# Patient Record
Sex: Female | Born: 1990
Health system: Southern US, Community
[De-identification: ages and names within clinical notes are randomized; demographics above are authoritative.]

---

## 2018-08-18 ENCOUNTER — Emergency Department (HOSPITAL_BASED_OUTPATIENT_CLINIC_OR_DEPARTMENT_OTHER): Payer: BC Managed Care – PPO

## 2018-08-18 ENCOUNTER — Emergency Department (HOSPITAL_BASED_OUTPATIENT_CLINIC_OR_DEPARTMENT_OTHER)
Admission: EM | Admit: 2018-08-18 | Discharge: 2018-08-18 | Disposition: A | Discharge status: Home / Self Care | Payer: BC Managed Care – PPO | Attending: Emergency Medicine | Admitting: Emergency Medicine

## 2018-08-18 ENCOUNTER — Encounter (HOSPITAL_BASED_OUTPATIENT_CLINIC_OR_DEPARTMENT_OTHER): Payer: Self-pay | Admitting: *Deleted

## 2018-08-18 ENCOUNTER — Other Ambulatory Visit: Payer: Self-pay

## 2018-08-18 DIAGNOSIS — R10816 Epigastric abdominal tenderness: Secondary | ICD-10-CM | POA: Insufficient documentation

## 2018-08-18 DIAGNOSIS — R112 Nausea with vomiting, unspecified: Secondary | ICD-10-CM | POA: Diagnosis not present

## 2018-08-18 DIAGNOSIS — Z79899 Other long term (current) drug therapy: Secondary | ICD-10-CM | POA: Diagnosis not present

## 2018-08-18 DIAGNOSIS — R197 Diarrhea, unspecified: Secondary | ICD-10-CM | POA: Diagnosis not present

## 2018-08-18 DIAGNOSIS — M7918 Myalgia, other site: Secondary | ICD-10-CM | POA: Diagnosis present

## 2018-08-18 LAB — CBC WITH DIFFERENTIAL/PLATELET
Abs Immature Granulocytes: 0.01 10*3/uL (ref 0.00–0.07)
Basophils Absolute: 0 10*3/uL (ref 0.0–0.1)
Basophils Relative: 0 %
Eosinophils Absolute: 0 10*3/uL (ref 0.0–0.5)
Eosinophils Relative: 0 %
HEMATOCRIT: 36.9 % (ref 36.0–46.0)
Hemoglobin: 11.9 g/dL — ABNORMAL LOW (ref 12.0–15.0)
IMMATURE GRANULOCYTES: 0 %
Lymphocytes Relative: 30 %
Lymphs Abs: 1 10*3/uL (ref 0.7–4.0)
MCH: 27.9 pg (ref 26.0–34.0)
MCHC: 32.2 g/dL (ref 30.0–36.0)
MCV: 86.6 fL (ref 80.0–100.0)
Monocytes Absolute: 0.1 10*3/uL (ref 0.1–1.0)
Monocytes Relative: 3 %
Neutro Abs: 2.2 10*3/uL (ref 1.7–7.7)
Neutrophils Relative %: 67 %
Platelets: 154 10*3/uL (ref 150–400)
RBC: 4.26 MIL/uL (ref 3.87–5.11)
RDW: 13.3 % (ref 11.5–15.5)
WBC: 3.3 10*3/uL — ABNORMAL LOW (ref 4.0–10.5)
nRBC: 0 % (ref 0.0–0.2)

## 2018-08-18 LAB — URINALYSIS, MICROSCOPIC (REFLEX)

## 2018-08-18 LAB — COMPREHENSIVE METABOLIC PANEL
ALBUMIN: 3.5 g/dL (ref 3.5–5.0)
ALT: 66 U/L — ABNORMAL HIGH (ref 0–44)
AST: 176 U/L — ABNORMAL HIGH (ref 15–41)
Alkaline Phosphatase: 40 U/L (ref 38–126)
Anion gap: 9 (ref 5–15)
BUN: 15 mg/dL (ref 6–20)
CO2: 21 mmol/L — ABNORMAL LOW (ref 22–32)
Calcium: 8.4 mg/dL — ABNORMAL LOW (ref 8.9–10.3)
Chloride: 97 mmol/L — ABNORMAL LOW (ref 98–111)
Creatinine, Ser: 1.13 mg/dL — ABNORMAL HIGH (ref 0.44–1.00)
GFR calc Af Amer: >60 mL/min (ref >60)
GFR calc non Af Amer: >60 mL/min (ref >60)
Glucose, Bld: 96 mg/dL (ref 70–99)
Potassium: 3.4 mmol/L — ABNORMAL LOW (ref 3.5–5.1)
Sodium: 127 mmol/L — ABNORMAL LOW (ref 135–145)
Total Bilirubin: 0.6 mg/dL (ref 0.3–1.2)
Total Protein: 8 g/dL (ref 6.5–8.1)

## 2018-08-18 LAB — URINALYSIS, ROUTINE W REFLEX MICROSCOPIC
Glucose, UA: NEGATIVE mg/dL
KETONES UR: 15 mg/dL — AB
Leukocytes, UA: NEGATIVE
NITRITE: NEGATIVE
PH: 6 (ref 5.0–8.0)
Protein, ur: >300 mg/dL — AB
Specific Gravity, Urine: 1.025 (ref 1.005–1.030)

## 2018-08-18 LAB — LIPASE, BLOOD: Lipase: 193 U/L — ABNORMAL HIGH (ref 11–51)

## 2018-08-18 LAB — PREGNANCY, URINE: Preg Test, Ur: NEGATIVE

## 2018-08-18 MED ORDER — SODIUM CHLORIDE 0.9 % IV BOLUS
1000.0000 mL | Freq: Once | INTRAVENOUS | Status: AC
  Administered 2018-08-18: 1000 mL via INTRAVENOUS

## 2018-08-18 MED ORDER — ACETAMINOPHEN 325 MG PO TABS
650.0000 mg | ORAL_TABLET | Freq: Once | ORAL | Status: AC
  Administered 2018-08-18: 650 mg via ORAL
  Filled 2018-08-18: qty 2

## 2018-08-18 MED ORDER — IOPAMIDOL (ISOVUE-300) INJECTION 61%
100.0000 mL | Freq: Once | INTRAVENOUS | Status: AC | PRN
  Administered 2018-08-18: 100 mL via INTRAVENOUS

## 2018-08-18 MED ORDER — AMOXICILLIN-POT CLAVULANATE 875-125 MG PO TABS: 1.0000 | ORAL_TABLET | Freq: Two times a day (BID) | ORAL | 0 refills | Status: AC

## 2018-08-18 MED ORDER — ONDANSETRON HCL 4 MG/2ML IJ SOLN
4.0000 mg | Freq: Once | INTRAMUSCULAR | Status: AC
  Administered 2018-08-18: 4 mg via INTRAVENOUS
  Filled 2018-08-18: qty 2

## 2018-08-18 MED ORDER — ONDANSETRON 4 MG PO TBDP: 4.0000 mg | ORAL_TABLET | Freq: Three times a day (TID) | ORAL | 0 refills | Status: AC | PRN

## 2018-08-18 MED FILL — ONDANSETRON ODT 4 MG TABLET: 4 | 5 days supply | Qty: 18 | Fill #0

## 2018-08-18 MED FILL — AMOX-CLAV 875-125 MG TABLET: 875-125 | 7 days supply | Qty: 14 | Fill #0

## 2018-08-18 NOTE — ED Notes (Signed)
Instructed to force fluids, has drank 12 ounces of water, since ED arrival. Water at bedside

## 2018-08-18 NOTE — ED Triage Notes (Signed)
Pt was diagnosed with the flu on 08/02/18.. she started feeling worse on 01/04. Pt went to [place] on 08/2018-02/2019. She has been having fever, diarrhea, and flu like symptoms every since.

## 2018-08-18 NOTE — ED Provider Notes (Signed)
[facility] EMERGENCY DEPARTMENT Provider Note   CSN: [id number] Arrival date & time: 08/18/18  1102   History   Chief Complaint Chief Complaint  Patient presents with  . Influenza    HPI Jaime Robbins is a 28 y.o. female with no significant past medical history who presents for evaluation of influenza-like symptoms.  Patient states she was diagnosed with influenza on 08/05/2018.  Patient states she started to feel better and went on vacation to [place].  Patient states while in [place] she started to feel ill again.  Patient states she has been running a subjective temperature, all over body aches and pains, nausea, vomiting and diarrhea.  Patient states this began on 08/12/2018.  Patient had taken nothing for symptoms.  Patient states she is able to keep down liquids and "some food" however not everything.  Patient states she has had multiple episodes of nonbloody, non-bilious emesis as well as nonbloody diarrhea.  Patient states she did drink "a lot of alcohol" while she was in [place].  Denies recent antibiotics.  States her traveling companions do not have any symptoms.  Patient states she was trying to be careful and drink bottled water while she was gone however she is unsure if any of the food she was ingesting was washed with regular water while she was away.  Patient states she is also had rhinorrhea, nasal congestion, nonproductive cough as well as sore throat.  Denies aggravating or alleviating factors.  Able to tolerate p.o. liquids without difficulty.  History provided by patient.  No interpreter was used.  HPI  History reviewed. No pertinent past medical history.  There are no active problems to display for this patient.   History reviewed. No pertinent surgical history.   OB History   No obstetric history on file.      Home Medications    Prior to Admission medications   Medication Sig Start Date End Date Taking? Authorizing Provider  citalopram (CELEXA) 10 MG  tablet Take 10 mg by mouth daily.   Yes [provider]  amoxicillin-clavulanate (AUGMENTIN) 875-125 MG tablet Take 1 tablet by mouth 2 (two) times daily for 7 days. 08/18/18 08/25/18  Cherolyn Behrle A, PA-C  ondansetron (ZOFRAN ODT) 4 MG disintegrating tablet Take 1 tablet (4 mg total) by mouth every 8 (eight) hours as needed for nausea or vomiting. 08/18/18   Ladashia Demarinis A, PA-C    Family History History reviewed. No pertinent family history.  Social History Social History   Tobacco Use  . Smoking status: Never Smoker  . Smokeless tobacco: Never Used  Substance Use Topics  . Alcohol use: Yes    Alcohol/week: 2.0 standard drinks    Types: 1 Cans of beer, 1 Shots of liquor per week  . Drug use: Never     Allergies   Patient has no known allergies.   Review of Systems Review of Systems  Constitutional: Negative for activity change, appetite change, diaphoresis and unexpected weight change.       Subjective fever.  HENT: Positive for congestion, postnasal drip, rhinorrhea and sore throat. Negative for ear discharge, ear pain, facial swelling, mouth sores, nosebleeds, sinus pressure, sinus pain, sneezing, tinnitus, trouble swallowing and voice change.   Eyes: Negative.   Respiratory: Positive for cough. Negative for choking, chest tightness, shortness of breath, wheezing and stridor.   Cardiovascular: Negative.   Gastrointestinal: Positive for diarrhea, nausea and vomiting. Negative for abdominal distention, abdominal pain, anal bleeding, blood in stool and constipation.  Genitourinary: Negative.   Musculoskeletal: Negative.   Skin: Negative.   Neurological: Negative.   All other systems reviewed and are negative.    Physical Exam Updated Vital Signs BP 132/70 (BP Location: Right Arm)   Pulse 84   Temp 99.1 F (37.3 C) (Oral)   Resp 18   Ht 5\' 4"  (1.626 m)   Wt 52.2 kg   LMP 08/02/2018 (Approximate) Comment: neg upreg  SpO2 100%   BMI 19.74 kg/m    Physical Exam Vitals signs and nursing note reviewed.  Constitutional:      General: She is not in acute distress.    Appearance: She is well-developed. She is not ill-appearing, toxic-appearing or diaphoretic.  HENT:     Head: Normocephalic and atraumatic.     Right Ear: Tympanic membrane, ear canal and external ear normal. There is no impacted cerumen.     Left Ear: Tympanic membrane, ear canal and external ear normal. There is no impacted cerumen.     Nose: Congestion and rhinorrhea present.     Comments: Clear rhinorrhea to bilateral nares.    Mouth/Throat:     Pharynx: Oropharynx is clear.     Comments: Posterior oropharynx without erythema.  Tonsils without edema or exudate.  Uvula midline without deviation.  No evidence of oropharyngeal lesions.  Mucous membranes dry.  No drooling, dysphasia or trismus.  Phonation normal. Eyes:     Pupils: Pupils are equal, round, and reactive to light.  Neck:     Musculoskeletal: Normal range of motion.     Comments: No stiffness or rigidity. Cardiovascular:     Rate and Rhythm: Tachycardia present.     Pulses: Normal pulses.     Heart sounds: Normal heart sounds. No murmur. No gallop.   Pulmonary:     Effort: No respiratory distress.     Comments: Clear to auscultation bilaterally without wheeze, rhonchi or rales.  Oxygen saturation 100% on room air with good waveform.  Able to speak in full sentences without difficulty.  No signs of acute respiratory distress.  No accessory muscle usage. Abdominal:     General: There is no distension.     Comments: Soft.  There is mild epigastric tenderness.  No rebound or guarding.  Negative CVA tenderness.  Musculoskeletal: Normal range of motion.     Comments: Able to move all extremities without difficulty.  Ambulates in department without difficulty.  Skin:    General: Skin is warm and dry.     Comments: No rashes.  Neurological:     Mental Status: She is alert.      ED Treatments / Results   Labs (all labs ordered are listed, but only abnormal results are displayed) Labs Reviewed  CBC WITH DIFFERENTIAL/PLATELET - Abnormal; Notable for the following components:      Result Value   WBC 3.3 (*)    Hemoglobin 11.9 (*)    All other components within normal limits  LIPASE, BLOOD - Abnormal; Notable for the following components:   Lipase 193 (*)    All other components within normal limits  COMPREHENSIVE METABOLIC PANEL - Abnormal; Notable for the following components:   Sodium 127 (*)    Potassium 3.4 (*)    Chloride 97 (*)    CO2 21 (*)    Creatinine, Ser 1.13 (*)    Calcium 8.4 (*)    AST 176 (*)    ALT 66 (*)    All other components within normal limits  URINALYSIS, ROUTINE  W REFLEX MICROSCOPIC - Abnormal; Notable for the following components:   Hgb urine dipstick MODERATE (*)    Bilirubin Urine SMALL (*)    Ketones, ur 15 (*)    Protein, ur >300 (*)    All other components within normal limits  URINALYSIS, MICROSCOPIC (REFLEX) - Abnormal; Notable for the following components:   Bacteria, UA MANY (*)    Non Squamous Epithelial PRESENT (*)    All other components within normal limits  PREGNANCY, URINE    EKG None  Radiology Dg Chest 2 View  Result Date: 08/18/2018 CLINICAL DATA:  Cough, congestion, body aches EXAM: CHEST - 2 VIEW COMPARISON:  None. FINDINGS: The heart size and mediastinal contours are within normal limits. Both lungs are clear. The visualized skeletal structures are unremarkable. IMPRESSION: No active cardiopulmonary disease. Electronically Signed   By: Elige Ko   On: 08/18/2018 11:48   Ct Abdomen Pelvis W Contrast  Result Date: 08/18/2018 CLINICAL DATA:  Elevated serum lipase with nausea and vomiting EXAM: CT ABDOMEN AND PELVIS WITH CONTRAST TECHNIQUE: Multidetector CT imaging of the abdomen and pelvis was performed using the standard protocol following bolus administration of intravenous contrast. CONTRAST:  ISOVUE-300 IOPAMIDOL  (ISOVUE-300) INJECTION 61% COMPARISON:  None. FINDINGS: Lower chest: Lung bases are clear. Hepatobiliary: No focal liver lesions are appreciable. There is focal hepatic steatosis near the fissure for the ligamentum teres. Gallbladder wall is not appreciably thickened. There is no biliary duct dilatation. Pancreas: There is no appreciable pancreatic edema or inflammatory change. No pancreatic duct dilatation. No mass or calcification. Pancreas enhances uniformly. Spleen: No splenic lesions are demonstrable. Adrenals/Urinary Tract: Adrenals bilaterally appear unremarkable. Kidneys bilaterally show no evident mass or hydronephrosis on either side. There is no evident renal or ureteral calculus on either side. Urinary bladder is midline with wall thickness within normal limits. Stomach/Bowel: There is fluid in the distal colon and rectum without wall thickening. No mesenteric thickening evident. No small bowel dilatation or small bowel wall thickening. No evident bowel obstruction. No evident free air or portal venous air. Vascular/Lymphatic: No abdominal aortic aneurysm. No vascular lesions are evident in the abdomen or pelvis. No adenopathy appreciable in the abdomen or pelvis. Reproductive: Uterus is anteverted.  No evident pelvic mass. Other: Appendix appears unremarkable. No abscess or ascites is evident in the abdomen or pelvis. Musculoskeletal: No blastic or lytic bone lesions. No intramuscular or abdominal wall lesion. IMPRESSION: 1.  Pancreas appears unremarkable by CT.  No pancreatitis evident. 2. Fluid in the distal colon and rectum may be secondary to diarrhea/possible distal colitis. Bowel elsewhere appears unremarkable. No evident bowel obstruction. No abscess in the abdomen or pelvis. Appendix region appears normal. 3.  No evident renal or ureteral calculus.  No hydronephrosis. Electronically Signed   By: Bretta Bang III M.D.   On: 08/18/2018 13:56    Procedures Procedures (including critical  care time)  Medications Ordered in ED Medications  acetaminophen (TYLENOL) tablet 650 mg (650 mg Oral Given 08/18/18 1129)  sodium chloride 0.9 % bolus 1,000 mL (0 mLs Intravenous Stopped 08/18/18 1439)  ondansetron (ZOFRAN) injection 4 mg (4 mg Intravenous Given 08/18/18 1253)  iopamidol (ISOVUE-300) 61 % injection 100 mL (100 mLs Intravenous Contrast Given 08/18/18 1343)  sodium chloride 0.9 % bolus 1,000 mL ( Intravenous Stopped 08/18/18 1541)     Initial Impression / Assessment and Plan / ED Course  I have reviewed the triage vital signs and the nursing notes.  Pertinent labs & imaging results  that were available during my care of the patient were reviewed by me and considered in my medical decision making (see chart for details).  28 year old female who appears otherwise well presents for evaluation of influenza-like symptoms.  Patient was diagnosed with influenza approximately 2 weeks ago.  Patient states she was started feel better however when a vacation to [place] and symptoms started to return.  Patient with subjective fever, nausea, vomiting, diarrhea as well as nasal congestion, nonproductive cough and sore throat.  Patient is had multiple episodes of nonbloody, nonbilious emesis as well as nonbloody diarrhea.  Abdomen with mild epigastric tenderness without rebound or guarding.  Negative CVA tenderness.  Mucous membranes dry.  Posterior pharynx clear.  Able to tolerate p.o. intake in department that difficulty.  Patient is hypotensive on arrival.  She states she does have a history of low blood pressure, however does not know what her blood pressure "normally runs."  Patient febrile at 101.1.  Given recent travel, nausea, vomiting, diarrhea will obtain basic labs, urine and reevaluate. No recent Abx use. Low suspicion for C.Diff.  1230: CBC without leukocytosis, urinalysis negative for infection, there is ketones and protein, likely due to dehydration.  Negative pregnancy test, hyponatremia  at 127, hypokalemia at 3.4, elevation in creatinine at 1.13.  AST 176, ALT 66, lipase 193.  X-ray negative for infiltrates, pulmonary edema, pneumothorax.  Given patient febrile with elevated lipase will obtain CT scan to rule out pancreatic abscess or additional abdominal pathology causing patient's symptoms.  Patient blood pressure was slight improvement to 106/76 with 1 L of fluids.  We will plan for 1 additional liter of fluids and reevaluate.  1400: Reevaluation patient has no complaints.  Abdomen soft, nontender without rebound or guarding.  CT scan with fluid in the distal colon and rectum, possibly from diarrhea versus distal colitis. On reevaluation patient with no episodes of emesis in department.  Has been able to tolerate p.o. intake.  Blood pressure with improvement to 132/70, no tachycardia.  Patient with negative orthostatic vital signs.  Given recent travel will DC home with antibiotics and have patient follow-up with PCP for reevaluation.  Low suspicion for emergent pathology at this time causing patient's symptoms.  Patient is nontoxic, nonseptic appearing, in no apparent distress.  Patient's pain and other symptoms adequately managed in emergency department. Patient does not meet the SIRS or Sepsis criteria.  On repeat exam patient does not have a surgical abdomin and there are no peritoneal signs.  No indication of appendicitis, bowel obstruction, bowel perforation, cholecystitis, diverticulitis, PID or ectopic pregnancy.  Patient discharged home with symptomatic treatment and given strict instructions for follow-up with their primary care physician.  I have also discussed reasons to return immediately to the ER.  Patient expresses understanding and agrees with plan.  I have discussed patient with my attending Dr. Deretha Emory who is in agreement with the above treatment, plan and disposition of patient.    Final Clinical Impressions(s) / ED Diagnoses   Final diagnoses:  Nausea vomiting  and diarrhea    ED Discharge Orders         Ordered    amoxicillin-clavulanate (AUGMENTIN) 875-125 MG tablet  2 times daily     08/18/18 1639    ondansetron (ZOFRAN ODT) 4 MG disintegrating tablet  Every 8 hours PRN     08/18/18 1639           Jaylena Holloway A, PA-C 08/18/18 2029    Vanetta Mulders, MD 08/29/18 1448

## 2018-08-18 NOTE — Discharge Instructions (Addendum)
Evaluated  today for nausea, vomiting and diarrhea.  Your CT scan showed possible colitis.  I prescribed antibiotics.  Please take as prescribed.  I have also given you Zofran.  This medication is dissolvable.  Please place under your tongue.  You may take this medication every 6-8 hours as needed for nausea.  Please make sure to drink plenty of fluids to you remain hydrated.  Follow-up with PCP for reevaluation next week.  Return to the ED for any worsening symptoms.

## 2018-08-18 NOTE — ED Notes (Signed)
Patient transported to CT 

## 2018-08-18 NOTE — ED Notes (Signed)
Patient transported to X-ray 

## 2019-10-08 IMAGING — CR DG CHEST 2V
2 series · 2 of 2 positions shown · non-contrast
Comparison: None.

CLINICAL DATA: Cough, congestion, body aches

EXAM:
CHEST - 2 VIEW

[w chest pa]
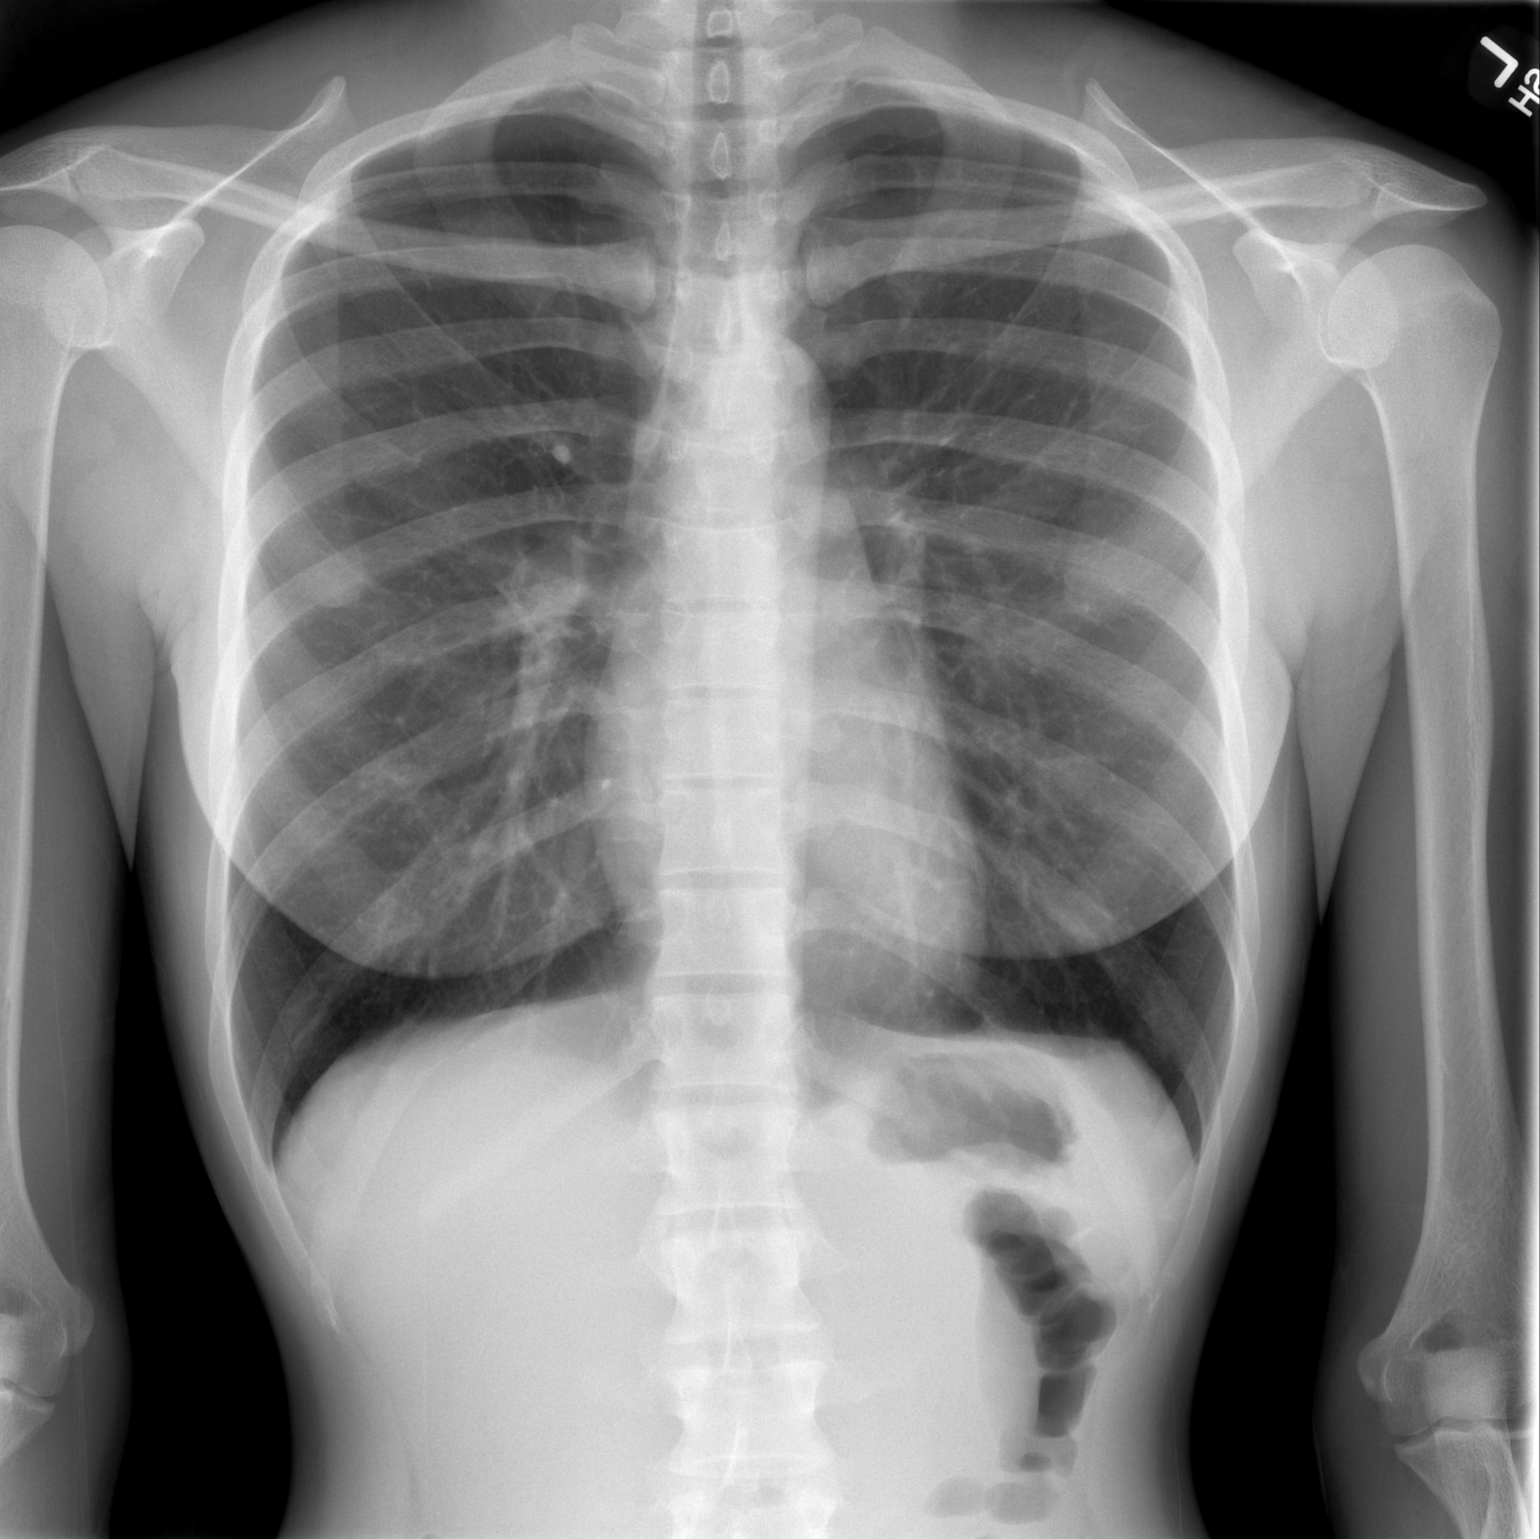

[w chest lat]
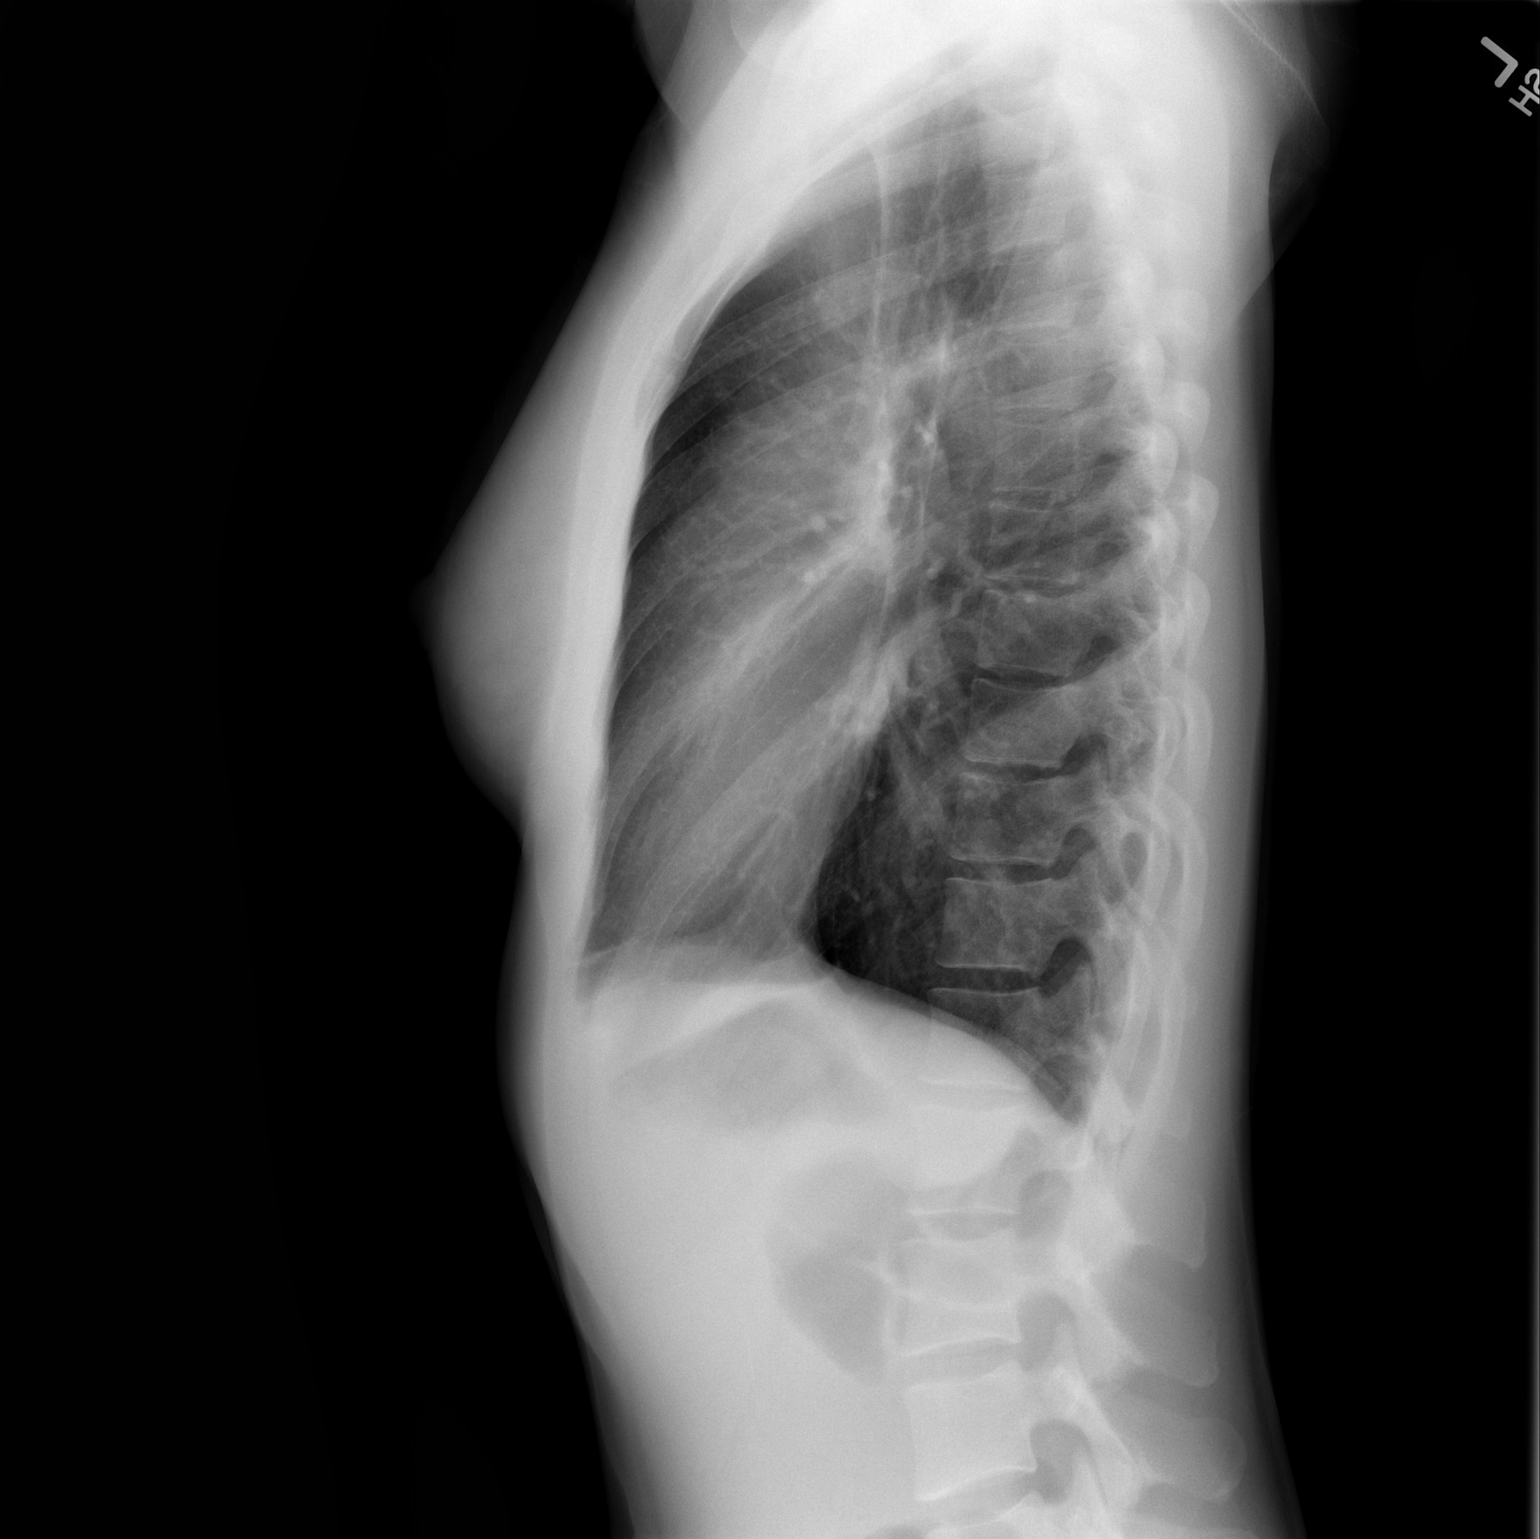

[2 of 2 positions shown; findings below may reference images not displayed]

FINDINGS: The heart size and mediastinal contours are within normal limits.
Both lungs are clear. The visualized skeletal structures are
unremarkable.
IMPRESSION: No active cardiopulmonary disease.

## 2019-10-08 IMAGING — CT CT ABD-PELV W/ CM
2 of 4 series · 16 of 46 positions shown, 18 images · IV contrast (APPLIED)
Comparison: None.

CLINICAL DATA: Elevated serum lipase with nausea and vomiting

EXAM:
CT ABDOMEN AND PELVIS WITH CONTRAST
TECHNIQUE: Multidetector CT imaging of the abdomen and pelvis was performed
using the standard protocol following bolus administration of
intravenous contrast.
CONTRAST:  100mL [id number]-3CC IOPAMIDOL (HH6P2E-3CC) INJECTION 61%

[Series 2: axial st · axial · 0.67mm/px · z∈[-380,-20]mm · 13 of 81 slices shown, 15 images]
[im 6/81  soft-tissue]
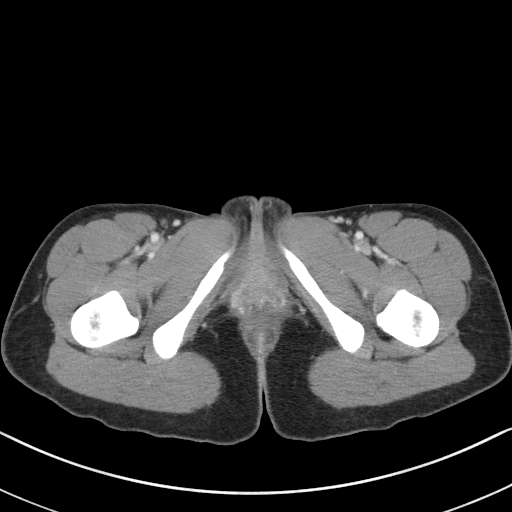
[im 6/81  bone]
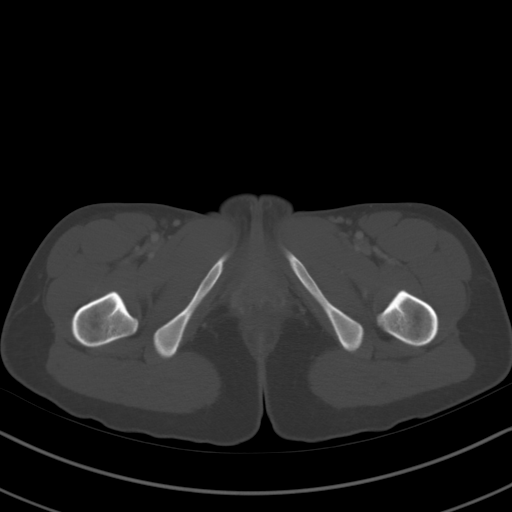
[im 12/81  soft-tissue]
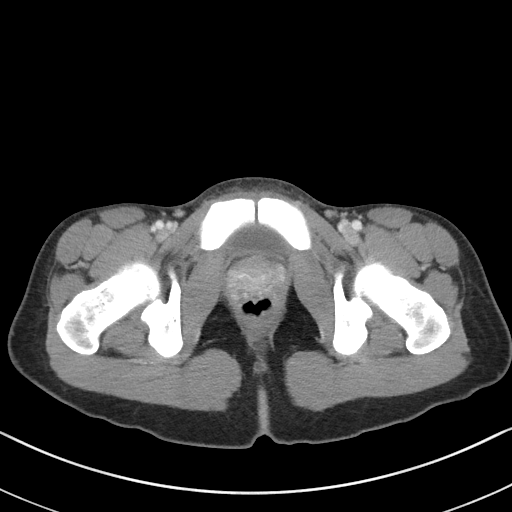
[im 18/81  soft-tissue]
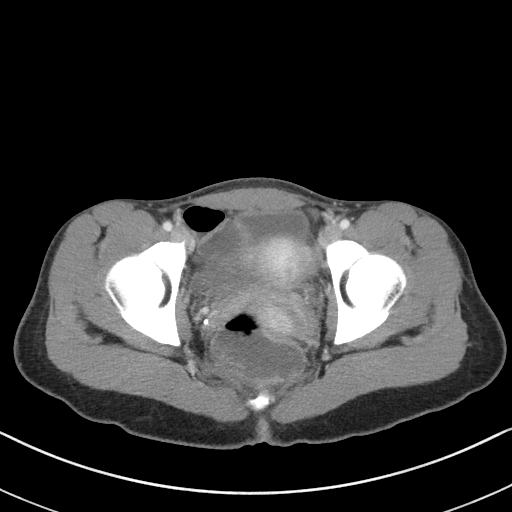
[im 24/81  soft-tissue]
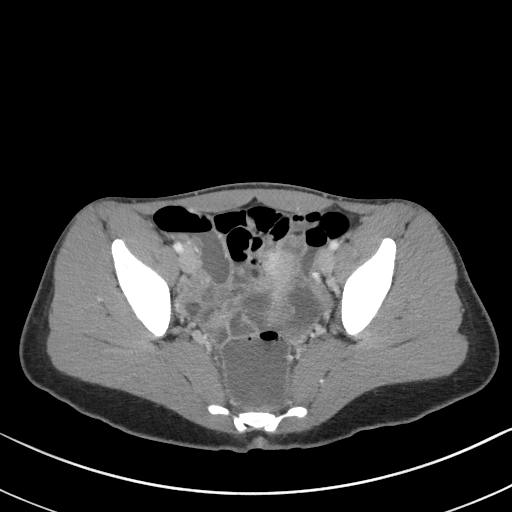
[im 30/81  soft-tissue]
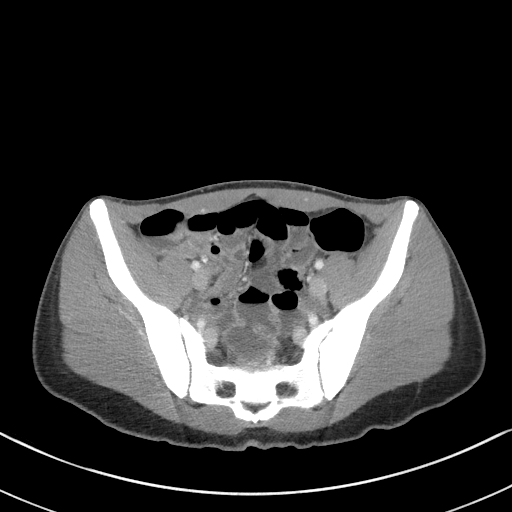
[im 36/81  soft-tissue]
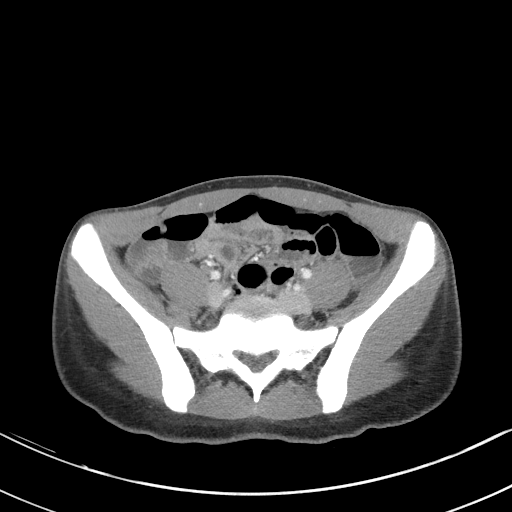
[im 42/81  soft-tissue]
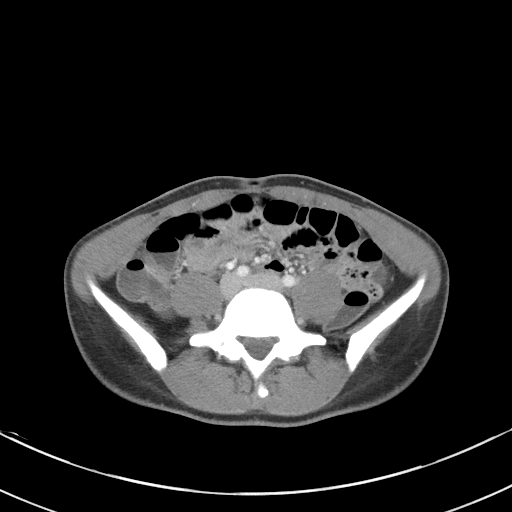
[im 48/81  soft-tissue]
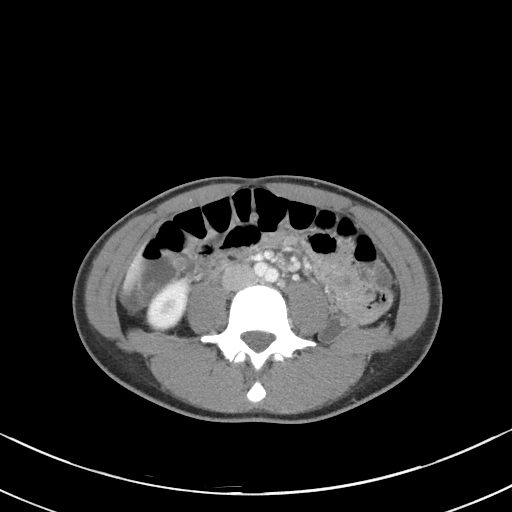
[im 54/81  soft-tissue]
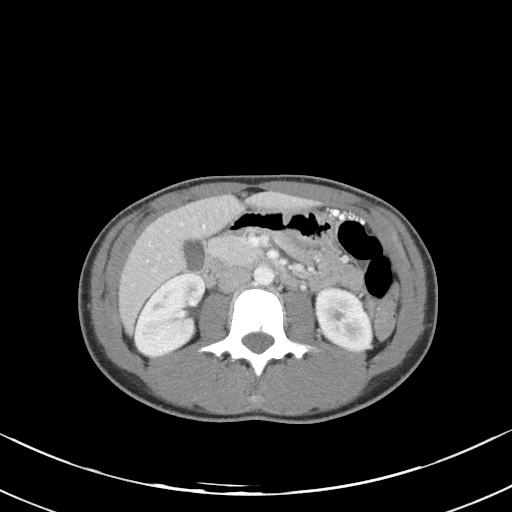
[im 54/81  bone]
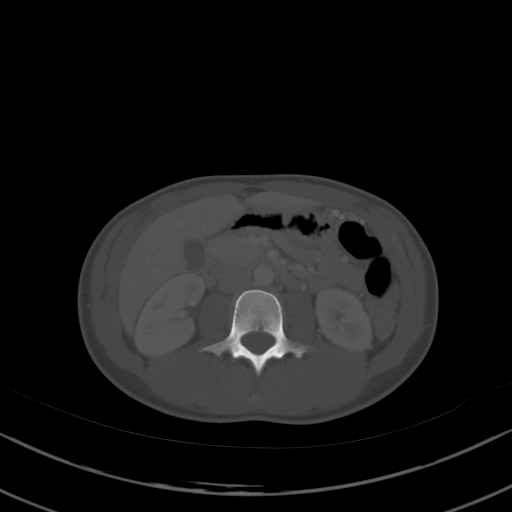
[im 60/81  soft-tissue]
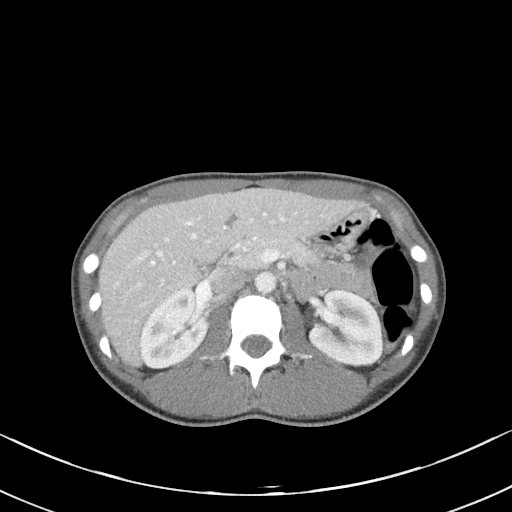
[im 66/81  soft-tissue]
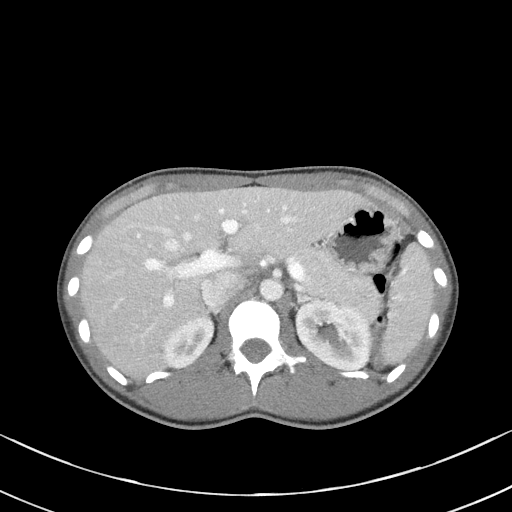
[im 72/81  soft-tissue]
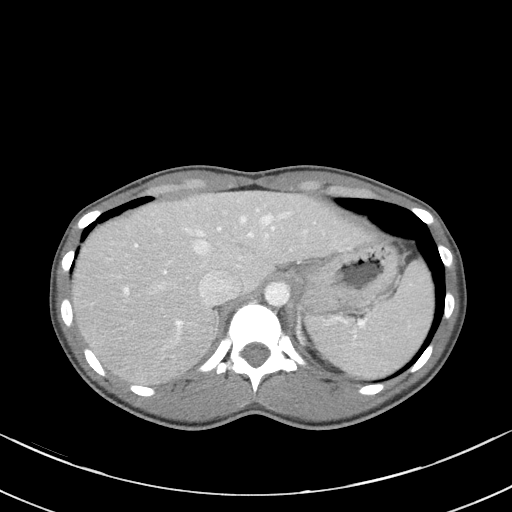
[im 78/81  soft-tissue]
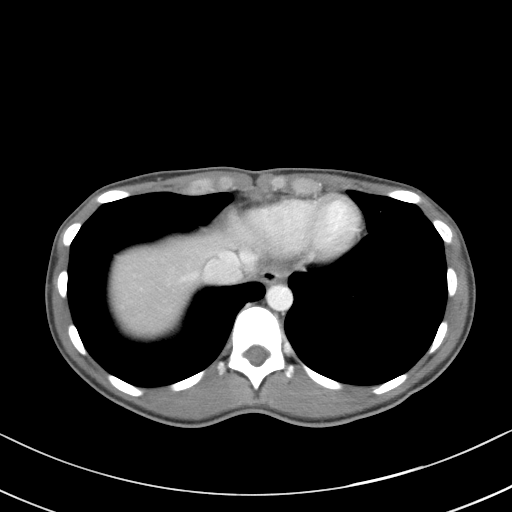

[Series 5: coronal st · coronal · 0.62mm/px · 3 of 63 slices shown]
[im 21/63  soft-tissue]
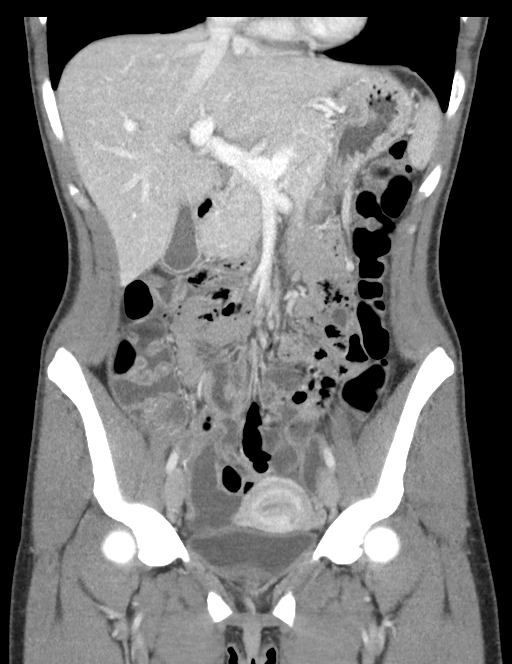
[im 28/63  soft-tissue]
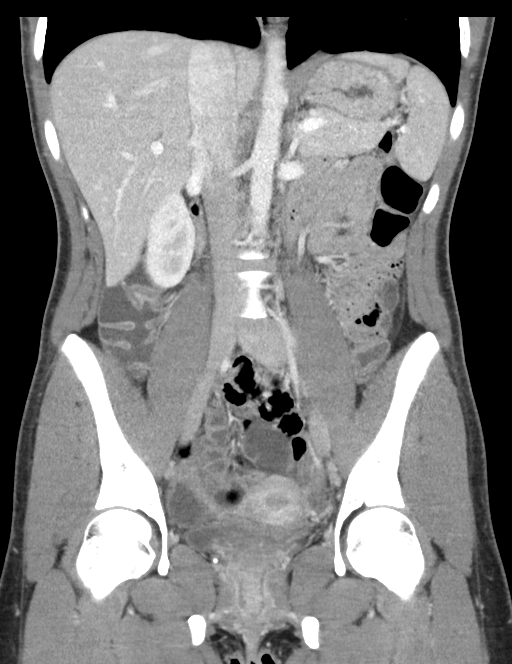
[im 35/63  soft-tissue]
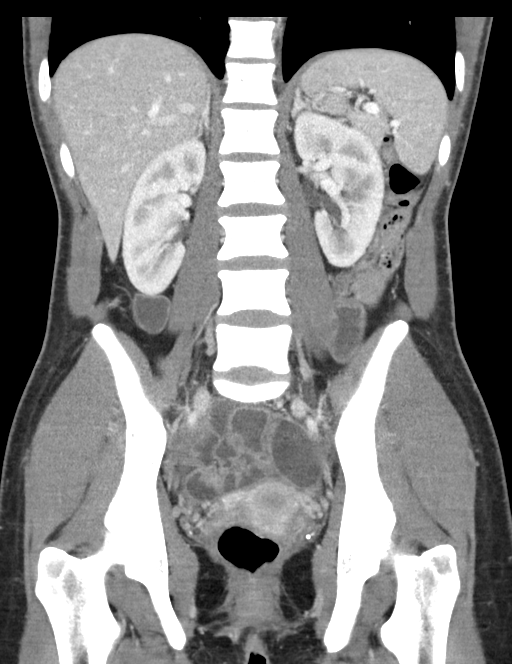

[16 of 46 positions shown; findings below may reference images not displayed]

FINDINGS: Lower chest: Lung bases are clear.

Hepatobiliary: No focal liver lesions are appreciable. There is
focal hepatic steatosis near the fissure for the ligamentum teres.
Gallbladder wall is not appreciably thickened. There is no biliary
duct dilatation.

Pancreas: There is no appreciable pancreatic edema or inflammatory
change. No pancreatic duct dilatation. No mass or calcification.
Pancreas enhances uniformly.

Spleen: No splenic lesions are demonstrable.

Adrenals/Urinary Tract: Adrenals bilaterally appear unremarkable.
Kidneys bilaterally show no evident mass or hydronephrosis on either
side. There is no evident renal or ureteral calculus on either side.
Urinary bladder is midline with wall thickness within normal limits.

Stomach/Bowel: There is fluid in the distal colon and rectum without
wall thickening. No mesenteric thickening evident. No small bowel
dilatation or small bowel wall thickening. No evident bowel
obstruction. No evident free air or portal venous air.

Vascular/Lymphatic: No abdominal aortic aneurysm. No vascular
lesions are evident in the abdomen or pelvis. No adenopathy
appreciable in the abdomen or pelvis.

Reproductive: Uterus is anteverted.  No evident pelvic mass.

Other: Appendix appears unremarkable. No abscess or ascites is
evident in the abdomen or pelvis.

Musculoskeletal: No blastic or lytic bone lesions. No intramuscular
or abdominal wall lesion.
IMPRESSION: 1.  Pancreas appears unremarkable by CT.  No pancreatitis evident.

2. Fluid in the distal colon and rectum may be secondary to
diarrhea/possible distal colitis. Bowel elsewhere appears
unremarkable. No evident bowel obstruction. No abscess in the
abdomen or pelvis. Appendix region appears normal.

3.  No evident renal or ureteral calculus.  No hydronephrosis.
